# Patient Record
Sex: Female | Born: 1990 | Race: White | Hispanic: No | Marital: Single | Smoking: Never smoker
Health system: Southern US, Community
[De-identification: ages and names within clinical notes are randomized; demographics above are authoritative.]

---

## 2015-04-15 ENCOUNTER — Emergency Department (HOSPITAL_COMMUNITY)
Admission: EM | Admit: 2015-04-15 | Discharge: 2015-04-15 | Disposition: A | Discharge status: Home / Self Care | Payer: Self-pay | Attending: Emergency Medicine | Admitting: Emergency Medicine

## 2015-04-15 ENCOUNTER — Encounter (HOSPITAL_COMMUNITY): Payer: Self-pay | Admitting: Emergency Medicine

## 2015-04-15 DIAGNOSIS — S29002A Unspecified injury of muscle and tendon of back wall of thorax, initial encounter: Secondary | ICD-10-CM | POA: Insufficient documentation

## 2015-04-15 DIAGNOSIS — S0083XA Contusion of other part of head, initial encounter: Secondary | ICD-10-CM | POA: Insufficient documentation

## 2015-04-15 DIAGNOSIS — Y9301 Activity, walking, marching and hiking: Secondary | ICD-10-CM | POA: Insufficient documentation

## 2015-04-15 DIAGNOSIS — Y998 Other external cause status: Secondary | ICD-10-CM | POA: Insufficient documentation

## 2015-04-15 DIAGNOSIS — W010XXA Fall on same level from slipping, tripping and stumbling without subsequent striking against object, initial encounter: Secondary | ICD-10-CM

## 2015-04-15 DIAGNOSIS — Y9289 Other specified places as the place of occurrence of the external cause: Secondary | ICD-10-CM | POA: Insufficient documentation

## 2015-04-15 DIAGNOSIS — W01198A Fall on same level from slipping, tripping and stumbling with subsequent striking against other object, initial encounter: Secondary | ICD-10-CM | POA: Insufficient documentation

## 2015-04-15 NOTE — ED Notes (Addendum)
Pt states she tripped walking out of class after stepping into a place where you can charge your phone. Pt denies LOC or any dizziness. Pt also c/o of right knee pain. Pt has a small hematoma to right forehead.

## 2015-04-15 NOTE — ED Provider Notes (Signed)
CSN: [id number]     Arrival date & time 04/15/15  1740 History  By signing my name below, I, Katrina Hobbs, attest that this documentation has been prepared under the direction and in the presence of Felicie Morn, NP. Electronically Signed: Elon Hobbs ED Scribe. 04/15/2015. 6:45 PM.    Chief Complaint  Patient presents with  . Fall  . Head Injury   The history is provided by the patient. No language interpreter was used.   HPI Comments: Lennox Leikam is a 25 y.o. female who presents to the Emergency Department complaining of a mechanical fall PTA. The patient reports she tripped and fell forward into a door - hitting her right forehead on the door but never falling to the ground.  The patient reports brief, transient dizziness and nausea immediately after the incident.  Currently, she reports mild pain and swelling on the right forehead as well as right upper back pain.  She reports coming to the ED at the insistence of her mother.  She denies CP, arm pain, wrist pain, neck pain.   History reviewed. No pertinent past medical history. History reviewed. No pertinent past surgical history. No family history on file. Social History  Substance Use Topics  . Smoking status: Never Smoker   . Smokeless tobacco: None  . Alcohol Use: No   OB History    No data available     Review of Systems  Eyes: Negative for visual disturbance.  Gastrointestinal: Positive for vomiting. Negative for nausea.  Neurological: Negative for dizziness, syncope and weakness.  All other systems reviewed and are negative.  A complete 10 system review of systems was obtained and all systems are negative except as noted in the HPI and PMH.   Allergies  Review of patient's allergies indicates no known allergies.  Home Medications   Prior to Admission medications   Not on File   BP 136/81 mmHg  Pulse 70  Temp(Src) 98.2 F (36.8 C) (Oral)  Resp 16  SpO2 99%  LMP 04/15/2015 Physical Exam  Constitutional: She  is oriented to person, place, and time. She appears well-developed and well-nourished. No distress.  HENT:  Head: Normocephalic and atraumatic.  Minimal hematoma to the right forehead  Eyes: Conjunctivae and EOM are normal.  Neck: Neck supple. No tracheal deviation present.  Cardiovascular: Normal rate.   Pulmonary/Chest: Effort normal. No respiratory distress.  Musculoskeletal: Normal range of motion.  Neurological: She is alert and oriented to person, place, and time.  Skin: Skin is warm and dry.  Psychiatric: She has a normal mood and affect. Her behavior is normal.  Nursing note and vitals reviewed.   ED Course  Procedures (including critical care time)  DIAGNOSTIC STUDIES: Oxygen Saturation is 99% on RA, normal by my interpretation.    COORDINATION OF CARE:  6:58 PM Discussed that imaging is not indicated at this time.  Return precautions advised.  Patient may use ibuprofen as needed.  Patient acknowledges and agrees with plan.     Labs Review Labs Reviewed - No data to display  Imaging Review No results found. I have personally reviewed and evaluated these images and lab results as part of my medical decision-making.   EKG Interpretation None     Mechanical fall resulting in striking forehead on door. No loss of consciousness. Small area of minimal swelling to right forehead. Neuro exam grossly normal.  Do not think imaging is necessary today.  Return precautions discussed with patient. MDM   Final diagnoses:  None    Mechanical fall. Forehead contusion. Right scapular muscle tenderness.  I personally performed the services described in this documentation, which was scribed in my presence. The recorded information has been reviewed and is accurate.   Felicie Morn, NP 04/15/15 1919  Glynn Octave, MD 04/16/15 [phone]

## 2015-04-15 NOTE — Discharge Instructions (Signed)
Facial or Scalp Contusion ° A facial or scalp contusion is a deep bruise on the face or head. Contusions happen when an injury causes bleeding under the skin. Signs of bruising include pain, puffiness (swelling), and discolored skin. The contusion may turn blue, purple, or yellow. °HOME CARE °· Only take medicines as told by your doctor. °· Put ice on the injured area. °¨ Put ice in a plastic bag. °¨ Place a towel between your skin and the bag. °¨ Leave the ice on for 20 minutes, 2-3 times a day. °GET HELP IF: °· You have bite problems. °· You have pain when chewing. °· You are worried about your face not healing normally. °GET HELP RIGHT AWAY IF:  °· You have severe pain or a headache and medicine does not help. °· You are very tired or confused, or your personality changes. °· You throw up (vomit). °· You have a nosebleed that will not stop. °· You see two of everything (double vision) or have blurry vision. °· You have fluid coming from your nose or ear. °· You have problems walking or using your arms or legs. °MAKE SURE YOU:  °· Understand these instructions. °· Will watch your condition. °· Will get help right away if you are not doing well or get worse. °  °This information is not intended to replace advice given to you by your health care provider. Make sure you discuss any questions you have with your health care provider. °  °Document Released: 02/05/2011 Document Revised: 03/09/2014 Document Reviewed: 09/29/2012 °Elsevier Interactive Patient Education ©2016 [employer] ° °

## 2015-07-16 ENCOUNTER — Emergency Department (HOSPITAL_COMMUNITY)
Admission: EM | Admit: 2015-07-16 | Discharge: 2015-07-16 | Disposition: A | Discharge status: Home / Self Care | Payer: BLUE CROSS/BLUE SHIELD | Attending: Emergency Medicine | Admitting: Emergency Medicine

## 2015-07-16 ENCOUNTER — Encounter (HOSPITAL_COMMUNITY): Payer: Self-pay

## 2015-07-16 ENCOUNTER — Emergency Department (HOSPITAL_COMMUNITY): Payer: BLUE CROSS/BLUE SHIELD

## 2015-07-16 DIAGNOSIS — S6010XA Contusion of unspecified finger with damage to nail, initial encounter: Secondary | ICD-10-CM

## 2015-07-16 DIAGNOSIS — Y9389 Activity, other specified: Secondary | ICD-10-CM | POA: Insufficient documentation

## 2015-07-16 DIAGNOSIS — S90212A Contusion of left great toe with damage to nail, initial encounter: Secondary | ICD-10-CM | POA: Insufficient documentation

## 2015-07-16 DIAGNOSIS — Y9289 Other specified places as the place of occurrence of the external cause: Secondary | ICD-10-CM | POA: Diagnosis not present

## 2015-07-16 DIAGNOSIS — S99929A Unspecified injury of unspecified foot, initial encounter: Secondary | ICD-10-CM

## 2015-07-16 DIAGNOSIS — W208XXA Other cause of strike by thrown, projected or falling object, initial encounter: Secondary | ICD-10-CM | POA: Diagnosis not present

## 2015-07-16 DIAGNOSIS — Y998 Other external cause status: Secondary | ICD-10-CM | POA: Diagnosis not present

## 2015-07-16 DIAGNOSIS — S99922A Unspecified injury of left foot, initial encounter: Secondary | ICD-10-CM | POA: Diagnosis present

## 2015-07-16 NOTE — Discharge Instructions (Signed)
You may take 600 mg ibuprofen 4 times daily as needed for pain relief. I also recommend resting, elevating and applying ice tear toe for 15-20 minutes 3-4 times daily to help with pain and swelling. Please follow up with a primary care provider from the Resource Guide provided below in 1-2 weeks as needed if your pain has not improved. Please return to the Emergency Department if symptoms worsen or new onset of fever, redness, swelling, warmth, numbness, tingling, weakness.

## 2015-07-16 NOTE — ED Provider Notes (Signed)
CSN: [id number]     Arrival date & time 07/16/15  1909 History  By signing my name below, I, Linna DarnerRussell Turner, attest that this documentation has been prepared under the direction and in the presence of non-physician practitioner, Melburn HakeNicole Jocelyne Reinertsen, PA-C. Electronically Signed: Linna Darnerussell Turner, Scribe. 07/16/2015. 8:12 PM.   Chief Complaint  Patient presents with  . Toe Injury    The history is provided by the patient. No language interpreter was used.     HPI Comments: Katrina RoachKaitlyn Geeslin is a 25 y.o. female with no significant PMHx who presents to the Emergency Department complaining of left big toe injury sustained s/p dropping the leg of a couch on her left big toe two days ago. Pt endorses throbbing, left big toe pain as well as swelling from the incident. She also notes that she feels pressure in her left big toenail. She endorses pain exacerbation with palpation to her left big toe and with bending of her left big toe. She notes that she experienced numbness in her left big toe yesterday which has subsided completely. Pt has taken ibuprofen for her pain with minimal, temporary relief. Pt has also applied ice to her left big toe. She denies left ankle pain, left foot pain, pain in her other left toes, neuro deficits, or any other associated symptoms. Pt is ambulatory.  History reviewed. No pertinent past medical history. History reviewed. No pertinent past surgical history. No family history on file. Social History  Substance Use Topics  . Smoking status: Never Smoker   . Smokeless tobacco: None  . Alcohol Use: No   OB History    No data available     Review of Systems  Musculoskeletal: Positive for joint swelling and arthralgias.  Neurological: Negative for numbness.       Negative for sensation loss.   Allergies  Review of patient's allergies indicates no known allergies.  Home Medications   Prior to Admission medications   Not on File   BP 152/94 mmHg  Pulse 77  Temp(Src) 98.9 F  (37.2 C) (Oral)  Resp 16  SpO2 100%  LMP 07/14/2015 Physical Exam  Constitutional: She is oriented to person, place, and time. She appears well-developed and well-nourished.  HENT:  Head: Normocephalic and atraumatic.  Eyes: Conjunctivae and EOM are normal. Right eye exhibits no discharge. Left eye exhibits no discharge. No scleral icterus.  Pulmonary/Chest: Effort normal.  Musculoskeletal:  Tender to palpation at left first interphalangeal toe joint and distal phalanx. Mild swelling and ecchymosis noted. Decreased active ROM of left great toe due to pain. Full passive ROM of left great toe; full active ROM of left forefoot, toes 2-5, and ankle. 2+ DP pulse. Sensation grossly intact. Cap refill < 2.  Neurological: She is alert and oriented to person, place, and time.  Skin:  Subungual hematoma at left great toe.  Nursing note and vitals reviewed.   ED Course  Procedures (including critical care time)  DIAGNOSTIC STUDIES: Oxygen Saturation is 100% on RA, normal by my interpretation.    COORDINATION OF CARE: 8:12 PM Discussed treatment plan with pt at bedside and pt agreed to plan.  Labs Review Labs Reviewed - No data to display  Imaging Review Dg Toe Great Left  07/16/2015  CLINICAL DATA:  LEFT great toe injury, dropped couch on toe 2 days ago, pain and purple bruising at distal phalanx EXAM: LEFT GREAT TOE COMPARISON:  None FINDINGS: Osseous mineralization normal. Joint spaces preserved. No acute fracture, dislocation or bone destruction.  IMPRESSION: No acute osseous abnormalities. Electronically Signed   By: Ulyses Southward M.D.   On: 07/16/2015 21:17   I have personally reviewed and evaluated these images and lab results as part of my medical decision-making.   EKG Interpretation None      MDM   Final diagnoses:  Toe injury  Subungual hematoma of digit of hand, initial encounter    Pt presents with left great toe pain after dropping a couch on her toe while moving  furniture. VSS. Exam revealed mild swelling, ecchymoses and TTP over left great toe. Subungual hematoma noted to left great toe. No laceration or abrasion noted. Left lower extremity neurovasculary intact. Left great toe xray negative. Due to onset of subungual hematoma being greater than 48 hours, I do not feel that trephination is warranted at this time. Discussed results and plan for d/c with pt. Plan to d/c pt home with symptomatic tx including RICE protocol. Advised pt to follow up with PCP as needed. Discussed strict return precautions with pt.   I personally performed the services described in this documentation, which was scribed in my presence. The recorded information has been reviewed and is accurate.   Satira Sark [place], [place] 07/17/15 1313  Nelva Nay, MD 07/18/15 [phone]

## 2015-07-16 NOTE — ED Notes (Signed)
Pt states she was moving furniture on Sunday and a couch fell onto her left big toe. Bruising and swelling noted

## 2017-11-22 IMAGING — DX DG TOE GREAT 2+V*L*
3 series · 3 of 3 positions shown · non-contrast
Comparison: None

CLINICAL DATA: LEFT great toe injury, dropped couch on toe 2 days
ago, pain and purple bruising at distal phalanx

EXAM:
LEFT GREAT TOE

[x toes ap left]
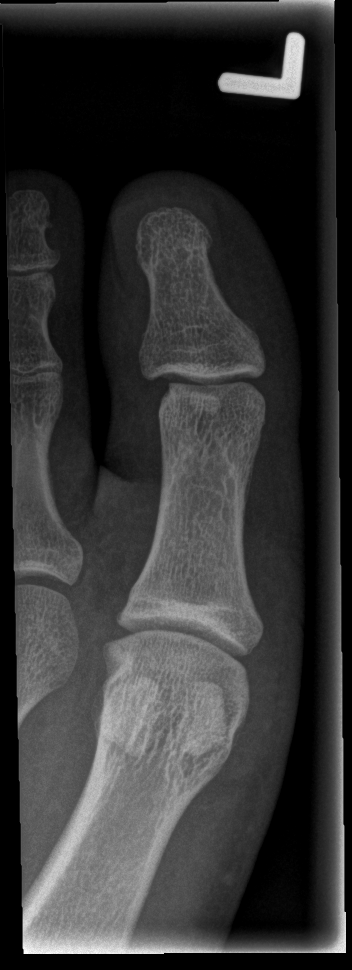

[x toes obl left]
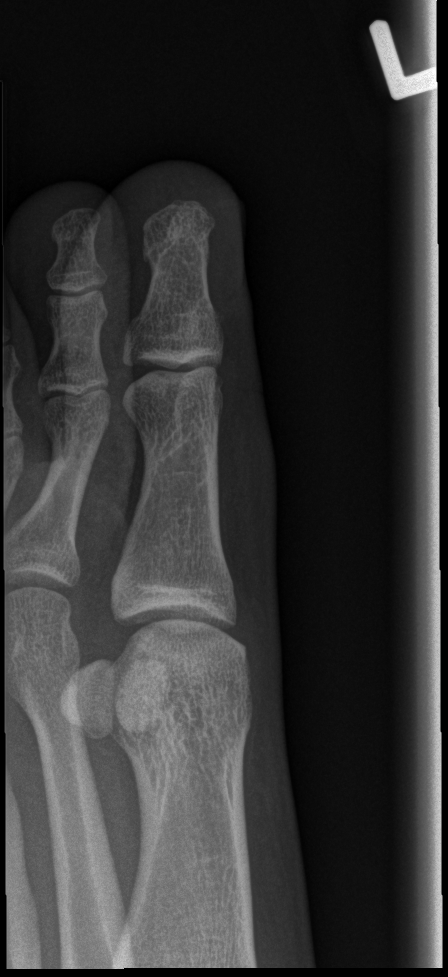

[x toes lat left]
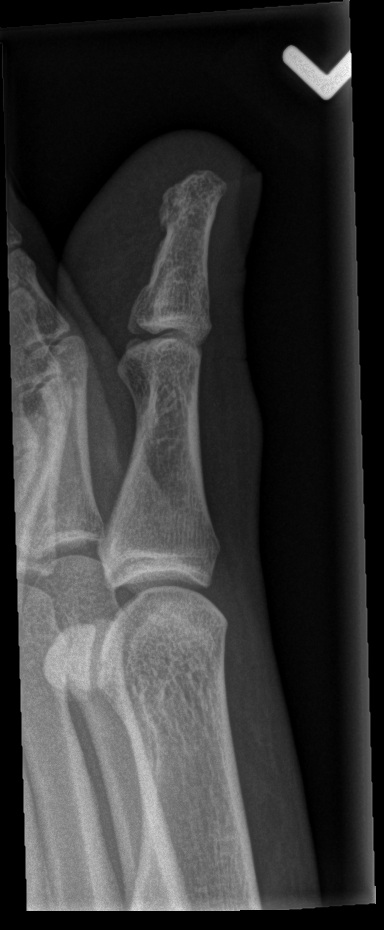

[3 of 3 positions shown; findings below may reference images not displayed]

FINDINGS: Osseous mineralization normal.

Joint spaces preserved.

No acute fracture, dislocation or bone destruction.
IMPRESSION: No acute osseous abnormalities.
